# Patient Record
Sex: Male | Born: 1970 | Race: Black or African American | Hispanic: No | Marital: Single
Health system: Southern US, Community
[De-identification: ages and names within clinical notes are randomized; demographics above are authoritative.]

---

## 2006-06-18 ENCOUNTER — Ambulatory Visit: Payer: Self-pay | Admitting: Internal Medicine

## 2006-06-19 ENCOUNTER — Inpatient Hospital Stay (HOSPITAL_COMMUNITY): Admission: EM | Admit: 2006-06-19 | Discharge: 2006-06-20 | Payer: Self-pay | Admitting: Emergency Medicine

## 2006-07-14 ENCOUNTER — Encounter: Admission: RE | Admit: 2006-07-14 | Discharge: 2006-08-03 | Payer: Self-pay | Admitting: Specialist
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
## 2016-02-17 ENCOUNTER — Telehealth: Payer: Self-pay

## 2016-02-17 NOTE — Telephone Encounter (Signed)
Error
